# Patient Record
Sex: Male | Born: 1990 | Hispanic: No | Smoking: Never smoker
Health system: Southern US, Community
[De-identification: ages and names within clinical notes are randomized; demographics above are authoritative.]

---
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
## 2019-10-27 ENCOUNTER — Ambulatory Visit: Payer: Self-pay | Attending: Internal Medicine

## 2019-10-27 DIAGNOSIS — Z23 Encounter for immunization: Secondary | ICD-10-CM

## 2019-10-27 NOTE — Progress Notes (Signed)
   Covid-19 Vaccination Clinic  Name:  [name]    MRN: [id number] DOB: 02-15-1991  10/27/2019  Mr. Jerry Pope was observed post Covid-19 immunization for 15 minutes without incident. He was provided with Vaccine Information Sheet and instruction to access the V-Safe system.   Mr. Jerry Pope was instructed to call 911 with any severe reactions post vaccine: Marland Kitchen Difficulty breathing  . Swelling of face and throat  . A fast heartbeat  . A bad rash all over body  . Dizziness and weakness   Immunizations Administered    Name Date Dose VIS Date Route   Pfizer COVID-19 Vaccine 10/27/2019 11:00 AM 0.3 mL 08/18/2018 Intramuscular   Manufacturer: [employer], [employer]   Lot: [id number]   NDC: [address]-6

## 2019-11-23 ENCOUNTER — Ambulatory Visit: Payer: Self-pay | Attending: Internal Medicine

## 2019-11-29 ENCOUNTER — Ambulatory Visit: Payer: Self-pay | Attending: Internal Medicine

## 2019-11-29 DIAGNOSIS — Z23 Encounter for immunization: Secondary | ICD-10-CM

## 2019-11-29 NOTE — Progress Notes (Signed)
   Covid-19 Vaccination Clinic  Name:  Jerry Pope    MRN: [id number] DOB: 02/06/1991  11/29/2019  Jerry Pope was observed post Covid-19 immunization for 15 minutes without incident. He was provided with Vaccine Information Sheet and instruction to access the V-Safe system.   Jerry Pope was instructed to call 911 with any severe reactions post vaccine: Marland Kitchen Difficulty breathing  . Swelling of face and throat  . A fast heartbeat  . A bad rash all over body  . Dizziness and weakness   Immunizations Administered    Name Date Dose VIS Date Route   Pfizer COVID-19 Vaccine 11/29/2019 10:49 AM 0.3 mL 08/18/2018 Intramuscular   Manufacturer: [employer], [employer]   Lot: [id number]   NDC: [address]-0

## 2021-12-12 ENCOUNTER — Ambulatory Visit: Payer: 59 | Admitting: Physician Assistant

## 2021-12-12 ENCOUNTER — Encounter: Payer: Self-pay | Admitting: Physician Assistant

## 2021-12-12 VITALS — BP 128/80 | HR 85 | Temp 98.2°F | Ht 65.5 in | Wt 192.5 lb

## 2021-12-12 DIAGNOSIS — Z136 Encounter for screening for cardiovascular disorders: Secondary | ICD-10-CM | POA: Diagnosis not present

## 2021-12-12 DIAGNOSIS — Z1159 Encounter for screening for other viral diseases: Secondary | ICD-10-CM

## 2021-12-12 DIAGNOSIS — Z23 Encounter for immunization: Secondary | ICD-10-CM

## 2021-12-12 DIAGNOSIS — M545 Low back pain, unspecified: Secondary | ICD-10-CM

## 2021-12-12 DIAGNOSIS — Z114 Encounter for screening for human immunodeficiency virus [HIV]: Secondary | ICD-10-CM

## 2021-12-12 DIAGNOSIS — M25511 Pain in right shoulder: Secondary | ICD-10-CM

## 2021-12-12 DIAGNOSIS — E669 Obesity, unspecified: Secondary | ICD-10-CM | POA: Diagnosis not present

## 2021-12-12 DIAGNOSIS — E790 Hyperuricemia without signs of inflammatory arthritis and tophaceous disease: Secondary | ICD-10-CM

## 2021-12-12 DIAGNOSIS — Z13220 Encounter for screening for lipoid disorders: Secondary | ICD-10-CM

## 2021-12-12 DIAGNOSIS — G8929 Other chronic pain: Secondary | ICD-10-CM

## 2021-12-12 LAB — CBC WITH DIFFERENTIAL/PLATELET
Basophils Absolute: 0.1 10*3/uL (ref 0.0–0.1)
Basophils Relative: 0.9 % (ref 0.0–3.0)
Eosinophils Absolute: 0.1 10*3/uL (ref 0.0–0.7)
Eosinophils Relative: 1.3 % (ref 0.0–5.0)
HCT: 44 % (ref 39.0–52.0)
Hemoglobin: 15.1 g/dL (ref 13.0–17.0)
Lymphocytes Relative: 28.6 % (ref 12.0–46.0)
Lymphs Abs: 1.8 10*3/uL (ref 0.7–4.0)
MCHC: 34.4 g/dL (ref 30.0–36.0)
MCV: 90.6 fl (ref 78.0–100.0)
Monocytes Absolute: 0.4 10*3/uL (ref 0.1–1.0)
Monocytes Relative: 5.7 % (ref 3.0–12.0)
Neutro Abs: 4 10*3/uL (ref 1.4–7.7)
Neutrophils Relative %: 63.5 % (ref 43.0–77.0)
Platelets: 247 10*3/uL (ref 150.0–400.0)
RBC: 4.85 Mil/uL (ref 4.22–5.81)
RDW: 13.5 % (ref 11.5–15.5)
WBC: 6.3 10*3/uL (ref 4.0–10.5)

## 2021-12-12 LAB — COMPREHENSIVE METABOLIC PANEL WITH GFR
ALT: 29 U/L (ref 0–53)
AST: 19 U/L (ref 0–37)
Albumin: 4.8 g/dL (ref 3.5–5.2)
Alkaline Phosphatase: 77 U/L (ref 39–117)
BUN: 14 mg/dL (ref 6–23)
CO2: 27 meq/L (ref 19–32)
Calcium: 9.7 mg/dL (ref 8.4–10.5)
Chloride: 104 meq/L (ref 96–112)
Creatinine, Ser: 0.96 mg/dL (ref 0.40–1.50)
GFR: 106.94 mL/min
Glucose, Bld: 95 mg/dL (ref 70–99)
Potassium: 3.7 meq/L (ref 3.5–5.1)
Sodium: 138 meq/L (ref 135–145)
Total Bilirubin: 0.5 mg/dL (ref 0.2–1.2)
Total Protein: 7.4 g/dL (ref 6.0–8.3)

## 2021-12-12 LAB — LIPID PANEL
Cholesterol: 199 mg/dL (ref 0–200)
HDL: 39.1 mg/dL (ref >39.00)
LDL Cholesterol: 125 mg/dL — ABNORMAL HIGH (ref 0–99)
NonHDL: 160.32
Total CHOL/HDL Ratio: 5
Triglycerides: 178 mg/dL — ABNORMAL HIGH (ref 0.0–149.0)
VLDL: 35.6 mg/dL (ref 0.0–40.0)

## 2021-12-12 LAB — URIC ACID: Uric Acid, Serum: 5.6 mg/dL (ref 4.0–7.8)

## 2021-12-12 LAB — TSH: TSH: 3.52 u[IU]/mL (ref 0.35–5.50)

## 2021-12-12 LAB — HEMOGLOBIN A1C: Hgb A1c MFr Bld: 5.5 % (ref 4.6–6.5)

## 2021-12-12 MED ORDER — MELOXICAM 15 MG PO TABS: 15.0000 mg | ORAL_TABLET | Freq: Every day | ORAL | 0 refills | Status: AC

## 2021-12-12 NOTE — Patient Instructions (Signed)
It was great to see you!  We updated tetanus today  Trial mobic 15 mg on days you work -- this is similar to ibuprofen -- do not take ibuprofen on the days that you take this  An order for an xray has been put in for you. To get your xray, you can walk in at the [facility] location without a scheduled appointment.  The address is [address] It is across the street from [facility]. X-ray is located in the basement.  Hours of operation are M-F 8:30am to 5:00pm. Please note that they are closed for lunch between 12:30 and 1:00pm.  A referral has been placed for you to see one of our fantastic providers at [facility]. Someone from their office will be in touch soon regarding scheduling your appointment.  Their location:  [facility] at [facility]  [address] on the 1st floor Phone number [phone] Fax [phone].   This location is across the street from the entrance to [employer] and in the same complex as the [facility]  Let's follow-up based on lab work results, sooner if you have concerns.  If a referral was placed today --> you will be contacted for an appointment. Please note that routine referrals can sometimes take up to 3-4 weeks to process. Please call our office if you haven't heard anything after this time frame.  If blood work, urine studies, or any imaging was ordered today -->  we will release your results to you on your MyChart account (if you have chosen to sign up for this) with further instructions. You may see the results before I do, but when I review them I will send you a message with my report or have my staff call you if things need to be discussed. Please reply to my message with any questions.   Take care,  Jarold Motto PA-C

## 2021-12-12 NOTE — Progress Notes (Signed)
Jerry Pope is a 31 y.o. male here for a new problem.  History of Present Illness:   Chief Complaint  Patient presents with   Establish Care   Back Pain    Pt c/o low back pain x 2 years, using icy hot, with some relief.   Shoulder Problem    Pt has been having right shoulder popping for a long time when he moves his shoulder, discomfort radiates to mid back area.    HPI  Back Pain;  Obesity Patient here to establish care. He presents with c/o of back pain that has been onset for 2 years. Located on low back. He has tried using icy hot with some relief. States his symptoms are usually worse at work. 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 Currently working 12-14 hours about 4 days a week.  Has had severe back pain due to standing long periods of time on his feet at work. He does admit he has gained about 20 pounds since last year. He has never tried any medication. Never had any imaging done for this issue. He has been trying to exercise and stretching with some improvement. No actual trauma to his back. Denies numbness or tingling, saddle anesthesia, bowel/bladder incontinence.  Shoulder Concern Patient has been experiencing popping in his right shoulder for a while. States he has been hearing some popping sounds whenever he lifts weights -- feels like something is "off" in this shoulder. States no concerns with ROM. No specific treatment tried. Denies any severe. Denies any other worsening symptoms. Denies any injury. He is left handed.  Hyperuricemia Hx of elevated uric acid. His cousin works in a lab in [employer] and found this. He denies any significant work-up -- would like this rechecked today.  History reviewed. No pertinent past medical history.   Social History   Tobacco Use   Smoking status: Never   Smokeless tobacco: Never  Vaping Use   Vaping Use: Never used  Substance Use Topics   Alcohol use: Yes    Alcohol/week: 6.0 standard drinks of alcohol     Types: 6 Cans of beer per week   Drug use: Never    History reviewed. No pertinent surgical history.  Family History  Problem Relation Age of Onset   Hypertension Mother    Diabetes Father    Cancer Neg Hx     No Known Allergies  Current Medications:   Current Outpatient Medications:    meloxicam (MOBIC) 15 MG tablet, Take 1 tablet (15 mg total) by mouth daily., Disp: 30 tablet, Rfl: 0   Review of Systems:   ROS Negative unless otherwise specified per HPI.   Vitals:   Vitals:   12/12/21 1300  BP: 128/80  Pulse: 85  Temp: 98.2 F (36.8 C)  TempSrc: Temporal  SpO2: 97%  Weight: 192 lb 8 oz (87.3 kg)  Height: 5' 5.5" (1.664 m)     Body mass index is 31.55 kg/m.  Physical Exam:   Physical Exam Vitals and nursing note reviewed.  Constitutional:      General: He is not in acute distress.    Appearance: He is well-developed. He is not ill-appearing or toxic-appearing.  Cardiovascular:     Rate and Rhythm: Normal rate and regular rhythm.     Pulses: Normal pulses.     Heart sounds: Normal heart sounds, S1 normal and S2 normal.  Pulmonary:     Effort: Pulmonary effort is normal.  Breath sounds: Normal breath sounds.  Skin:    General: Skin is warm and dry.  Neurological:     Mental Status: He is alert.     GCS: GCS eye subscore is 4. GCS verbal subscore is 5. GCS motor subscore is 6.  Psychiatric:        Speech: Speech normal.        Behavior: Behavior normal. Behavior is cooperative.     Assessment and Plan:   Chronic bilateral low back pain without sciatica No red flags other than chronicity Will obtain xray for further evaluation and management Recommend mobic 15 mg daily on long work days -- advised to not take ibuprofen while on this Consider PT  Chronic right shoulder pain No red flags Refer to sports medicine for further evaluation and mgmt  Need for prophylactic vaccination with combined diphtheria-tetanus-pertussis (DTP)  vaccine Completed today  Encounter for lipid screening for cardiovascular disease Updated lipid panel and provided recommendations accordingly  Screening for HIV (human immunodeficiency virus) Update today  Encounter for screening for other viral diseases Update today  Obesity, unspecified classification, unspecified obesity type, unspecified whether serious comorbidity present Update blood work to r/o thyroid or blood sugar issues  Hyperuricemia Update uric acid and consider allopurinol if indicated   I,Savera Zaman,acting as a Neurosurgeon for [employer], PA.,have documented all relevant documentation on the behalf of Jarold Motto, PA,as directed by  Jarold Motto, PA while in the presence of Jarold Motto, [place].   I, Jarold Motto, [place], have reviewed all documentation for this visit. The documentation on 12/12/21 for the exam, diagnosis, procedures, and orders are all accurate and complete.   Jarold Motto, PA-C

## 2021-12-13 LAB — HEPATITIS C ANTIBODY: Hepatitis C Ab: NONREACTIVE

## 2022-03-18 ENCOUNTER — Encounter: Payer: Self-pay | Admitting: *Deleted

## 2022-06-06 ENCOUNTER — Encounter: Payer: Self-pay | Admitting: *Deleted
